# Patient Record
Sex: Female | Born: 1940 | Race: White | Hispanic: No | Smoking: Former smoker
Health system: Southern US, Community
[De-identification: ages and names within clinical notes are randomized; demographics above are authoritative.]

## PROBLEM LIST (undated history)

## (undated) DIAGNOSIS — E119 Type 2 diabetes mellitus without complications: Secondary | ICD-10-CM

## (undated) DIAGNOSIS — C541 Malignant neoplasm of endometrium: Secondary | ICD-10-CM

## (undated) DIAGNOSIS — C3490 Malignant neoplasm of unspecified part of unspecified bronchus or lung: Secondary | ICD-10-CM

## (undated) DIAGNOSIS — C649 Malignant neoplasm of unspecified kidney, except renal pelvis: Secondary | ICD-10-CM

## (undated) DIAGNOSIS — C801 Malignant (primary) neoplasm, unspecified: Secondary | ICD-10-CM

## (undated) HISTORY — PX: APPENDECTOMY: SHX54

## (undated) HISTORY — PX: LUNG CANCER SURGERY: SHX702

## (undated) HISTORY — PX: ABDOMINAL HYSTERECTOMY: SHX81

---

## 2012-01-12 ENCOUNTER — Ambulatory Visit: Payer: Self-pay | Admitting: Family Medicine

## 2015-10-08 ENCOUNTER — Ambulatory Visit
Admission: RE | Admit: 2015-10-08 | Discharge: 2015-10-08 | Disposition: A | Discharge status: Home / Self Care | Payer: Medicare Other | Source: Ambulatory Visit | Attending: Family Medicine | Admitting: Family Medicine

## 2015-10-08 ENCOUNTER — Other Ambulatory Visit: Payer: Self-pay | Admitting: Family Medicine

## 2015-10-08 DIAGNOSIS — Z85118 Personal history of other malignant neoplasm of bronchus and lung: Secondary | ICD-10-CM | POA: Insufficient documentation

## 2015-10-08 DIAGNOSIS — M545 Low back pain: Secondary | ICD-10-CM | POA: Diagnosis present

## 2015-10-08 DIAGNOSIS — M8588 Other specified disorders of bone density and structure, other site: Secondary | ICD-10-CM | POA: Insufficient documentation

## 2016-02-26 ENCOUNTER — Encounter: Payer: Self-pay | Admitting: Emergency Medicine

## 2016-02-26 ENCOUNTER — Ambulatory Visit
Admission: EM | Admit: 2016-02-26 | Discharge: 2016-02-26 | Disposition: A | Discharge status: Home / Self Care | Payer: Medicare Other | Attending: Family Medicine | Admitting: Family Medicine

## 2016-02-26 DIAGNOSIS — M545 Low back pain, unspecified: Secondary | ICD-10-CM

## 2016-02-26 DIAGNOSIS — S39012A Strain of muscle, fascia and tendon of lower back, initial encounter: Secondary | ICD-10-CM | POA: Diagnosis not present

## 2016-02-26 HISTORY — DX: Malignant neoplasm of unspecified kidney, except renal pelvis: C64.9

## 2016-02-26 HISTORY — DX: Malignant (primary) neoplasm, unspecified: C80.1

## 2016-02-26 HISTORY — DX: Malignant neoplasm of unspecified part of unspecified bronchus or lung: C34.90

## 2016-02-26 HISTORY — DX: Type 2 diabetes mellitus without complications: E11.9

## 2016-02-26 HISTORY — DX: Malignant neoplasm of endometrium: C54.1

## 2016-02-26 MED ORDER — OXYCODONE-ACETAMINOPHEN 5-325 MG PO TABS: 1.0000 | ORAL_TABLET | Freq: Three times a day (TID) | ORAL | 0 refills | Status: AC | PRN

## 2016-02-26 NOTE — ED Triage Notes (Signed)
Patient c/o lower back pain off and on for several months.  Patient states that she needs a refill on her Oxycodone.

## 2016-02-26 NOTE — ED Provider Notes (Signed)
CSN: [id number]     Arrival date & time 02/26/16  1337 History   None    Chief Complaint  Patient presents with  . Back Pain   (Consider location/radiation/quality/duration/timing/severity/associated sxs/prior Treatment) HPI  This a 74 year old female who presents today complaining of the sudden onset of nonradiating low back pain. She indicates the midline as the area of maximal tenderness. She has had recurrent back pain for approximately 6 months I would wax and wane. She states this occurred when she had a new mattress that she was making her bed and had lift up the and straining her low back. Pain is nonradiating she has no incontinence. She's not able to utilize ibuprofen because of previous GI bleed. She has been receiving oxycodone and through Dr. Clemmie Krill and unfortunately Dr. Clemmie Krill is out of town on a vacation. He does have Flexeril at home but states that it doesn't help very much just makes her sleepy.     Past Medical History:  Diagnosis Date  . 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 Cancer ([place])   . Diabetes mellitus without complication ([place])   . Endometrial cancer ([place])   . Lung cancer [facility])    Past Surgical History:  Procedure Laterality Date  . ABDOMINAL HYSTERECTOMY    . APPENDECTOMY    . LUNG CANCER SURGERY Right    History reviewed. No pertinent family history. Social History  Substance Use Topics  . Smoking status: Former Research scientist (life sciences)  . Smokeless tobacco: Never Used  . Alcohol use No   OB History    No data available     Review of Systems  Constitutional: Positive for activity change. Negative for chills, fatigue and fever.  Musculoskeletal: Positive for back pain, gait problem and myalgias.  All other systems reviewed and are negative.   Allergies  Clindamycin/lincomycin  Home Medications   Prior to Admission medications   Medication Sig Start Date End Date Taking? Authorizing Provider  atorvastatin (LIPITOR) 20 MG tablet Take 20 mg by mouth daily.   Yes Historical  Provider, MD  Fe Fum-FePoly-Vit C-Vit B3 (INTEGRA PO) Take 1 tablet by mouth daily.   Yes Historical Provider, MD  lisinopril (PRINIVIL,ZESTRIL) 2.5 MG tablet Take 2.5 mg by mouth daily.   Yes Historical Provider, MD  metFORMIN (GLUCOPHAGE) 500 MG tablet Take 1,000 mg by mouth 2 (two) times daily with a meal.   Yes Historical Provider, MD  oxyCODONE-acetaminophen (PERCOCET/ROXICET) 5-325 MG tablet Take 1 tablet by mouth every 8 (eight) hours as needed for moderate pain or severe pain. 02/26/16   Lorin Picket, PA-C   Meds Ordered and Administered this Visit  Medications - No data to display  BP 134/71 (BP Location: Right Arm)   Pulse 98   Temp 97.7 F (36.5 C) (Tympanic)   Resp 17   SpO2 95%  No data found.   Physical Exam  Constitutional: She appears well-developed and well-nourished. No distress.  HENT:  Head: Normocephalic and atraumatic.  Eyes: EOM are normal. Pupils are equal, round, and reactive to light. Right eye exhibits no discharge. Left eye exhibits no discharge.  Neck: Normal range of motion. Neck supple.  Musculoskeletal:  Examination of lumbar spine shows a flattening of the lordotic curve. Her pelvis is level in stance. Next the tenderness is in the paraspinous muscles at L4-5 level. Reproduces her symptoms. She has marked decreased range of motion of the lumbar spine.  Skin: She is not diaphoretic.  Nursing note and vitals reviewed.   Urgent Care  Course   Clinical Course    Procedures (including critical care time)  Labs Review Labs Reviewed - No data to display  Imaging Review No results found.   Visual Acuity Review  Right Eye Distance:   Left Eye Distance:   Bilateral Distance:    Right Eye Near:   Left Eye Near:    Bilateral Near:         MDM   1. Midline low back pain without sciatica   2. Lumbar strain, initial encounter    Discharge Medication List as of 02/26/2016  2:26 PM    START taking these medications   Details   oxyCODONE-acetaminophen (PERCOCET/ROXICET) 5-325 MG tablet Take 1 tablet by mouth every 8 (eight) hours as needed for moderate pain or severe pain., Starting Thu 02/26/2016, Print      Plan: 1. Test/x-ray results and diagnosis reviewed with patient 2. rx as per orders; risks, benefits, potential side effects reviewed with patient 3. Recommend supportive treatment with Rest and avoidance of symptoms. She should take Flexeril at home. Since Dr. Clemmie Krill is out of town at the present time I will prescribe the patient only 6 oxycodone to use judiciously until Dr. Clemmie Krill returns and can follow-up with Dr. Clemmie Krill. I told her if she has worsening of her symptoms she should be seen in the emergency department. 4. F/u prn if symptoms worsen or don't improve     Lorin Picket, PA-C 02/26/16 1435

## 2016-10-07 ENCOUNTER — Ambulatory Visit: Payer: Medicare Other

## 2016-10-07 ENCOUNTER — Ambulatory Visit
Admission: EM | Admit: 2016-10-07 | Discharge: 2016-10-07 | Disposition: A | Discharge status: Home / Self Care | Payer: Medicare Other | Attending: Family Medicine | Admitting: Family Medicine

## 2016-10-07 ENCOUNTER — Ambulatory Visit (INDEPENDENT_AMBULATORY_CARE_PROVIDER_SITE_OTHER): Payer: Medicare Other

## 2016-10-07 DIAGNOSIS — S8002XA Contusion of left knee, initial encounter: Secondary | ICD-10-CM

## 2016-10-07 DIAGNOSIS — S0010XA Contusion of unspecified eyelid and periocular area, initial encounter: Secondary | ICD-10-CM | POA: Diagnosis not present

## 2016-10-07 DIAGNOSIS — S8001XA Contusion of right knee, initial encounter: Secondary | ICD-10-CM

## 2016-10-07 DIAGNOSIS — W19XXXA Unspecified fall, initial encounter: Secondary | ICD-10-CM

## 2016-10-07 MED ORDER — MUPIROCIN 2 % EX OINT: TOPICAL_OINTMENT | CUTANEOUS | 0 refills | Status: AC

## 2016-10-07 MED ORDER — MUPIROCIN 2 % EX OINT: TOPICAL_OINTMENT | CUTANEOUS | 0 refills | Status: DC

## 2016-10-07 NOTE — ED Notes (Signed)
ED Alveta Heimlich notified of pt regarding concerns voiced to this RN by pt neighbor who is accompanying pt. Neighbot out of pt room to nursing station voices concerns regarding pt having intermittent confusion this week ongoing since Sunday. Per neighbor pt refuses to go to hospital and agreed to see pcp yesterday where labs were done and MD wanted a head CT but after pt got home notified MD that she wasn't going and told them "she had a flat tire" Neighbor reports pt has a key to her house and let her self in this morning without her being home which is out of the ordinary for pt. Pt A&OX4 at this time. When this RN mentioned possibility of needing further care in the [facility] emergency department. Pt yelled stating "I am not going to that damn place, last time I was there I waited 18 hours in that waiting room" Pt redirected by RN and MD aware of pt refusal for ED care.

## 2016-10-07 NOTE — ED Provider Notes (Signed)
[facility]    CSN: [id number] Arrival date & time: 10/07/16  1712     History   Chief Complaint No chief complaint on file.   HPI Hannah Fletcher is a 76 y.o. female.   She is a 76 year old white female who fell today. Palate she saw her PCP yesterday and was having signs of altered mental status PCP was concerned and ordered an outpatient CT scan. She's fallen she has multiple contusions on her face and around her eyes and scalp. She denies any loss of consciousness when she fell. However she lives by self and knows it witnessed fall. She states fall occurred about 3:30. She states that she landed on both knees, both knees are hurting her. She also contused and bruised her elbows as well. His little bit unusual to match the patient having a 5 point stance which she fell injuring both knees both elbows and her face and head as well with one fall. Question whether her tetanus up-to-date to me that she does not know the test Dr. Clemmie Krill at the Dr. Kristen Cardinal is up-to-date.  She has a past history and no sarcoma diabetes endometrial cancer and lung cancer as well. She's had lung cancer surgery abdominal hysterectomy appendectomy and she uses O2 at night to help her sleep she does have history COPD as well. She is on Plavix and aspirin blood thinners as well. Had a history of coronary artery disease also. She is a former smoker and she is allergic to clindamycin lincomycin as well.  Her son is in the room at this time. Apparently is no status change last year she wanted being admitted which went the ER but she refused to go to the ER this time. Explained to her that being acute blood thinners and with the multiple contusions that she had a bilirubin for more comfortable having CT done in the ER with some possible see if MRI and C2 contrast be done if needed. She refuses. She did request pain medication. The form of Percocet.   The history is provided by the patient and a relative.  No language interpreter was used.  Fall  This is a new problem. The current episode started 12 to 24 hours ago. The problem has not changed since onset.Pertinent negatives include no chest pain, no abdominal pain, no headaches and no shortness of breath. Nothing aggravates the symptoms. Nothing relieves the symptoms. She has tried nothing for the symptoms. The treatment provided no relief.    Past Medical History:  Diagnosis Date  . 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 Cancer ([place])   . Diabetes mellitus without complication ([place])   . Endometrial cancer ([place])   . Lung cancer ([place])     There are no active problems to display for this patient.   Past Surgical History:  Procedure Laterality Date  . ABDOMINAL HYSTERECTOMY    . APPENDECTOMY    . LUNG CANCER SURGERY Right     OB History    No data available       Home Medications    Prior to Admission medications   Medication Sig Start Date End Date Taking? Authorizing Provider  clopidogrel (PLAVIX) 75 MG tablet Take 75 mg by mouth daily.   Yes Historical Provider, MD  atorvastatin (LIPITOR) 20 MG tablet Take 20 mg by mouth daily.    Historical Provider, MD  Fe Fum-FePoly-Vit C-Vit B3 (INTEGRA PO) Take 1 tablet by mouth daily.    Historical Provider, MD  lisinopril (  PRINIVIL,ZESTRIL) 2.5 MG tablet Take 2.5 mg by mouth daily.    Historical Provider, MD  metFORMIN (GLUCOPHAGE) 500 MG tablet Take 1,000 mg by mouth 2 (two) times daily with a meal.    Historical Provider, MD  mupirocin ointment (BACTROBAN) 2 % Apply over multiple abrasions 2-3 times a day 10/07/16   Frederich Cha, MD  oxyCODONE-acetaminophen (PERCOCET/ROXICET) 5-325 MG tablet Take 1 tablet by mouth every 8 (eight) hours as needed for moderate pain or severe pain. 02/26/16   Lorin Picket, PA-C    Family History No family history on file.  Social History Social History  Substance Use Topics  . Smoking status: Former Research scientist (life sciences)  . Smokeless tobacco: Never Used  . Alcohol use No      Allergies   Clindamycin/lincomycin   Review of Systems Review of Systems  HENT: Positive for facial swelling.   Respiratory: Negative for shortness of breath.   Cardiovascular: Negative for chest pain.  Gastrointestinal: Negative for abdominal pain.  Musculoskeletal: Positive for joint swelling and myalgias.  Skin: Positive for wound.  Neurological: Negative for headaches.  All other systems reviewed and are negative.    Physical Exam Triage Vital Signs ED Triage Vitals  Enc Vitals Group     BP 10/07/16 1723 (!) 95/47     Pulse Rate 10/07/16 1723 87     Resp 10/07/16 1723 16     Temp 10/07/16 1723 97.6 F (36.4 C)     Temp Source 10/07/16 1723 Oral     SpO2 10/07/16 1723 98 %     Weight 10/07/16 1728 125 lb (56.7 kg)     Height --      Head Circumference --      Peak Flow --      Pain Score 10/07/16 1729 10     Pain Loc --      Pain Edu? --      Excl. in [place]? --    No data found.   Updated Vital Signs BP (!) 95/47 (BP Location: Right Arm)   Pulse 87   Temp 97.6 F (36.4 C) (Oral)   Resp 16   Wt 125 lb (56.7 kg)   SpO2 98%   Visual Acuity Right Eye Distance:   Left Eye Distance:   Bilateral Distance:    Right Eye Near:   Left Eye Near:    Bilateral Near:     Physical Exam  Constitutional: She appears well-developed and well-nourished.  HENT:  Head: Normocephalic and atraumatic.    Right Ear: Hearing, tympanic membrane, external ear and ear canal normal.  Left Ear: Hearing, tympanic membrane, external ear and ear canal normal.  Nose: Right sinus exhibits no maxillary sinus tenderness and no frontal sinus tenderness. Left sinus exhibits no maxillary sinus tenderness and no frontal sinus tenderness.  Mouth/Throat: Uvula is midline and oropharynx is clear and moist.  Eyes: Pupils are equal, round, and reactive to light.  Neck: Normal range of motion. Neck supple.  Cardiovascular: Normal rate and regular rhythm.   Pulmonary/Chest: Effort normal  and breath sounds normal.  Neurological: She is alert. No cranial nerve deficit.  Skin: Skin is warm. Bruising noted.     H multiple bruising on both arms elbows she has multiple abrasions on arms elbows and on the knees swelling in both knees as well. She has bruising on the face for head and around both eyes. Neck supple  Psychiatric: She has a normal mood and affect.  Vitals reviewed.  UC Treatments / Results  Labs (all labs ordered are listed, but only abnormal results are displayed) Labs Reviewed - No data to display  EKG  EKG Interpretation None       Radiology Dg Knee Complete 4 Views Left  Result Date: 10/07/2016 CLINICAL DATA:  Fall bilateral knee pain- EXAM: LEFT KNEE - COMPLETE 4+ VIEW COMPARISON:  None. FINDINGS: No fracture or dislocation. No definite effusion. Mild diffuse osteopenia. Patchy arterial calcifications. Soft tissues otherwise unremarkable. IMPRESSION: Negative for fracture or dislocation. Arterial calcifications. Electronically Signed   By: Lucrezia Europe M.D.   On: 10/07/2016 19:12   Dg Knee Complete 4 Views Right  Result Date: 10/07/2016 CLINICAL DATA:  Fall bilateral knee pain- EXAM: RIGHT KNEE - COMPLETE 4+ VIEW COMPARISON:  None. FINDINGS: No fracture or dislocation. Moderate effusion in the suprapatellar bursa. Mild diffuse osteopenia. Narrowing of the articular cartilage in the lateral compartment with marginal spur formation from the lateral femoral condyle and lateral tibial plateau, and some subchondral sclerosis. Small spurs from the medial tibial plateau and patellar articular surface. Patchy arterial calcifications. Regional soft tissues otherwise unremarkable. IMPRESSION: 1. Negative for fracture or dislocation. 2. Tricompartmental degenerative change most marked laterally. 3. Moderate knee effusion. 4. Arterial calcifications. Electronically Signed   By: Lucrezia Europe M.D.   On: 10/07/2016 19:11    Procedures Procedures (including critical care  time)  Medications Ordered in UC Medications - No data to display   Initial Impression / Assessment and Plan / UC Course  I have reviewed the triage vital signs and the nursing notes.  Pertinent labs & imaging results that were available during my care of the patient were reviewed by me and considered in my medical decision making (see chart for details).   explained to patient and her son opposition nothing she should go to the ED for the facial trauma by being on 2 blood thinners history of some altered mental status and also with a history that was not consistent with just one fall but appears be multiple falls. She is adamant that she refuses to go and is reassure her that she is only seen on away when she walks to the ED which we can't. She will sign AMA form state that she refuses to have CT scan in the ED and refused to go to the ED and we will x-ray both knees unfortunate skin tags on her elbows both forearms and both knees are not going to be amiable to glue since skin flaps were basically abraded off. Will give a prescription for Bactrim ointment to use twice a day to prevent infection.    Final Clinical Impressions(s) / UC Diagnoses   Final diagnoses:  Contusion of left knee, initial encounter  Contusion of right knee, initial encounter  Contusion of periocular region, unspecified laterality, initial encounter    New Prescriptions Current Discharge Medication List    START taking these medications   Details  mupirocin ointment (BACTROBAN) 2 % Apply over multiple abrasions 2-3 times a day Qty: 22 g, Refills: 0       Should be noted the patient did experience confusion while having x-rays of the knees extended the knees were negative for any fractures just f arthritis and effusion.son is also concerned about the head injury but does not want to go his mother's advice may form was was given asthma signed by    Frederich Cha, MD 10/07/16 [phone]

## 2016-10-19 ENCOUNTER — Telehealth: Payer: Self-pay | Admitting: Family Medicine

## 2016-10-19 ENCOUNTER — Ambulatory Visit (INDEPENDENT_AMBULATORY_CARE_PROVIDER_SITE_OTHER): Payer: Medicare Other

## 2016-10-19 ENCOUNTER — Ambulatory Visit
Admission: EM | Admit: 2016-10-19 | Discharge: 2016-10-19 | Disposition: A | Discharge status: Home / Self Care | Payer: Medicare Other | Attending: Family Medicine | Admitting: Family Medicine

## 2016-10-19 DIAGNOSIS — S50812A Abrasion of left forearm, initial encounter: Secondary | ICD-10-CM | POA: Diagnosis not present

## 2016-10-19 DIAGNOSIS — T148XXA Other injury of unspecified body region, initial encounter: Secondary | ICD-10-CM | POA: Diagnosis not present

## 2016-10-19 DIAGNOSIS — S50312A Abrasion of left elbow, initial encounter: Secondary | ICD-10-CM

## 2016-10-19 DIAGNOSIS — W19XXXA Unspecified fall, initial encounter: Secondary | ICD-10-CM | POA: Diagnosis not present

## 2016-10-19 MED ORDER — AMOXICILLIN 875 MG PO TABS: 875.0000 mg | ORAL_TABLET | Freq: Two times a day (BID) | ORAL | 0 refills | Status: AC

## 2016-10-19 MED ORDER — TRAMADOL HCL 50 MG PO TABS: 50.0000 mg | ORAL_TABLET | Freq: Four times a day (QID) | ORAL | 0 refills | Status: AC | PRN

## 2016-10-19 NOTE — Telephone Encounter (Signed)
rx for amoxicillin sent

## 2016-10-19 NOTE — ED Provider Notes (Signed)
[facility]    CSN: [id number] Arrival date & time: 10/19/16  1344     History   Chief Complaint Chief Complaint  Patient presents with  . Fall    HPI Hannah Fletcher is a 76 y.o. female.   76 yo female presents with a c/o left forearm and elbow injuries after falling at home early this morning. Patient denies hitting her head, loss of consciousness, fainting, numbness/tingling, vision changes, vomiting. Patient has a history of falls in the past. Takes Plavix and aspirin.    The history is provided by the patient.    Past Medical History:  Diagnosis Date  . 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 Cancer ([place])   . Diabetes mellitus without complication ([place])   . Endometrial cancer ([place])   . Lung cancer ([place])     There are no active problems to display for this patient.   Past Surgical History:  Procedure Laterality Date  . ABDOMINAL HYSTERECTOMY    . APPENDECTOMY    . LUNG CANCER SURGERY Right     OB History    No data available       Home Medications    Prior to Admission medications   Medication Sig Start Date End Date Taking? Authorizing Provider  atorvastatin (LIPITOR) 20 MG tablet Take 20 mg by mouth daily.   Yes Historical Provider, MD  clopidogrel (PLAVIX) 75 MG tablet Take 75 mg by mouth daily.   Yes Historical Provider, MD  Fe Fum-FePoly-Vit C-Vit B3 (INTEGRA PO) Take 1 tablet by mouth daily.   Yes Historical Provider, MD  lisinopril (PRINIVIL,ZESTRIL) 2.5 MG tablet Take 2.5 mg by mouth daily.   Yes Historical Provider, MD  metFORMIN (GLUCOPHAGE) 500 MG tablet Take 1,000 mg by mouth 2 (two) times daily with a meal.   Yes Historical Provider, MD  mupirocin ointment (BACTROBAN) 2 % Apply over multiple abrasions 2-3 times a day 10/07/16  Yes Frederich Cha, MD  oxyCODONE-acetaminophen (PERCOCET/ROXICET) 5-325 MG tablet Take 1 tablet by mouth every 8 (eight) hours as needed for moderate pain or severe pain. 02/26/16   Lorin Picket, PA-C  traMADol (ULTRAM)  50 MG tablet Take 1 tablet (50 mg total) by mouth every 6 (six) hours as needed. 10/19/16   Norval Gable, MD    Family History History reviewed. No pertinent family history.  Social History Social History  Substance Use Topics  . Smoking status: Former Research scientist (life sciences)  . Smokeless tobacco: Never Used  . Alcohol use No     Allergies   Clindamycin/lincomycin   Review of Systems Review of Systems   Physical Exam Triage Vital Signs ED Triage Vitals  Enc Vitals Group     BP 10/19/16 1408 (!) 101/58     Pulse Rate 10/19/16 1408 83     Resp 10/19/16 1408 15     Temp 10/19/16 1408 97.5 F (36.4 C)     Temp Source 10/19/16 1408 Oral     SpO2 10/19/16 1408 94 %     Weight --      Height --      Head Circumference --      Peak Flow --      Pain Score 10/19/16 1355 5     Pain Loc --      Pain Edu? --      Excl. in [place]? --    No data found.   Updated Vital Signs BP (!) 101/58 (BP Location: Right Arm)   Pulse 83  Temp 97.5 F (36.4 C) (Oral)   Resp 15   SpO2 94%   Visual Acuity Right Eye Distance:   Left Eye Distance:   Bilateral Distance:    Right Eye Near:   Left Eye Near:    Bilateral Near:     Physical Exam  Constitutional: She is oriented to person, place, and time. She appears well-developed and well-nourished. No distress.  HENT:  Head: Normocephalic.  Right Ear: Tympanic membrane, external ear and ear canal normal.  Left Ear: Tympanic membrane, external ear and ear canal normal.  Nose: Nose normal.  Mouth/Throat: Uvula is midline, oropharynx is clear and moist and mucous membranes are normal.  Eyes: Conjunctivae and EOM are normal. Pupils are equal, round, and reactive to light. Right eye exhibits no discharge. Left eye exhibits no discharge. No scleral icterus.  Neck: Normal range of motion. Neck supple. No JVD present. No tracheal deviation present. No thyromegaly present.  Cardiovascular: Normal rate, regular rhythm, normal heart sounds and intact distal  pulses.   No murmur heard. Pulmonary/Chest: Effort normal and breath sounds normal. No stridor. No respiratory distress. She has no wheezes. She has no rales. She exhibits no tenderness.  Abdominal: Soft. Bowel sounds are normal. She exhibits no distension. There is no tenderness. There is no guarding.  Musculoskeletal: She exhibits no edema or tenderness.  Lymphadenopathy:    She has no cervical adenopathy.  Neurological: She is alert and oriented to person, place, and time. She has normal reflexes. No cranial nerve deficit or sensory deficit.  Skin: Skin is warm and dry. She is not diaphoretic.  Large skin abrasions and tears to the left forearm and elbow area; actively bleeding  Psychiatric: She has a normal mood and affect. Her behavior is normal. Judgment and thought content normal.  Nursing note and vitals reviewed.    UC Treatments / Results  Labs (all labs ordered are listed, but only abnormal results are displayed) Labs Reviewed - No data to display  EKG  EKG Interpretation None       Radiology Dg Elbow Complete Left  Result Date: 10/19/2016 CLINICAL DATA:  Multiple trauma secondary to a fall this morning. EXAM: LEFT ELBOW - COMPLETE 3+ VIEW COMPARISON:  None. FINDINGS: There is no evidence of fracture, dislocation, or joint effusion. There is no evidence of arthropathy or other focal bone abnormality. Soft tissues are unremarkable. IMPRESSION: Negative. Electronically Signed   By: Lorriane Shire M.D.   On: 10/19/2016 15:13   Dg Forearm Left  Result Date: 10/19/2016 CLINICAL DATA:  Multiple trauma secondary to a fall this morning. EXAM: LEFT FOREARM - 2 VIEW COMPARISON:  None. FINDINGS: There is no evidence of fracture or other focal bone lesions. Soft tissues are unremarkable. IMPRESSION: Negative. Electronically Signed   By: Lorriane Shire M.D.   On: 10/19/2016 15:14    Procedures Procedures (including critical care time)  Medications Ordered in UC Medications - No  data to display   Initial Impression / Assessment and Plan / UC Course  I have reviewed the triage vital signs and the nursing notes.  Pertinent labs & imaging results that were available during my care of the patient were reviewed by me and considered in my medical decision making (see chart for details).       Final Clinical Impressions(s) / UC Diagnoses   Final diagnoses:  Fall, initial encounter  Abrasion    New Prescriptions Discharge Medication List as of 10/19/2016  3:49 PM    START  taking these medications   Details  traMADol (ULTRAM) 50 MG tablet Take 1 tablet (50 mg total) by mouth every 6 (six) hours as needed., Starting Tue 10/19/2016, Normal       1. x-ray results and diagnosis reviewed with patient and daughter 2. rx as per orders above; reviewed possible side effects, interactions, risks and benefits; rx for amoxicillin  3. Recommend supportive treatment with wound care 4. Follow-up with PCP in 2-3 days or sooner prn   Norval Gable, MD 10/19/16 1945

## 2016-10-19 NOTE — Discharge Instructions (Signed)
Follow up with Primary Care physician within 1 week

## 2016-10-19 NOTE — ED Triage Notes (Addendum)
Pt fell this am and has multiple injuries, to her right arm, face, legs. Her right leg is visible swollen also.

## 2016-10-20 ENCOUNTER — Ambulatory Visit
Admission: RE | Admit: 2016-10-20 | Discharge: 2016-10-20 | Disposition: A | Discharge status: Home / Self Care | Payer: Medicare Other | Source: Ambulatory Visit | Attending: Family Medicine | Admitting: Family Medicine

## 2016-10-20 ENCOUNTER — Other Ambulatory Visit: Payer: Self-pay | Admitting: Family Medicine

## 2016-10-20 DIAGNOSIS — X58XXXA Exposure to other specified factors, initial encounter: Secondary | ICD-10-CM | POA: Insufficient documentation

## 2016-10-20 DIAGNOSIS — S9031XA Contusion of right foot, initial encounter: Secondary | ICD-10-CM

## 2016-10-20 DIAGNOSIS — S9032XA Contusion of left foot, initial encounter: Secondary | ICD-10-CM

## 2017-07-19 DEATH — deceased

## 2018-10-06 IMAGING — CR DG FOREARM 2V*L*
2 series · 2 of 2 positions shown · non-contrast
Comparison: None.

CLINICAL DATA: Multiple trauma secondary to a fall this morning.

EXAM:
LEFT FOREARM - 2 VIEW

[forearm ap]
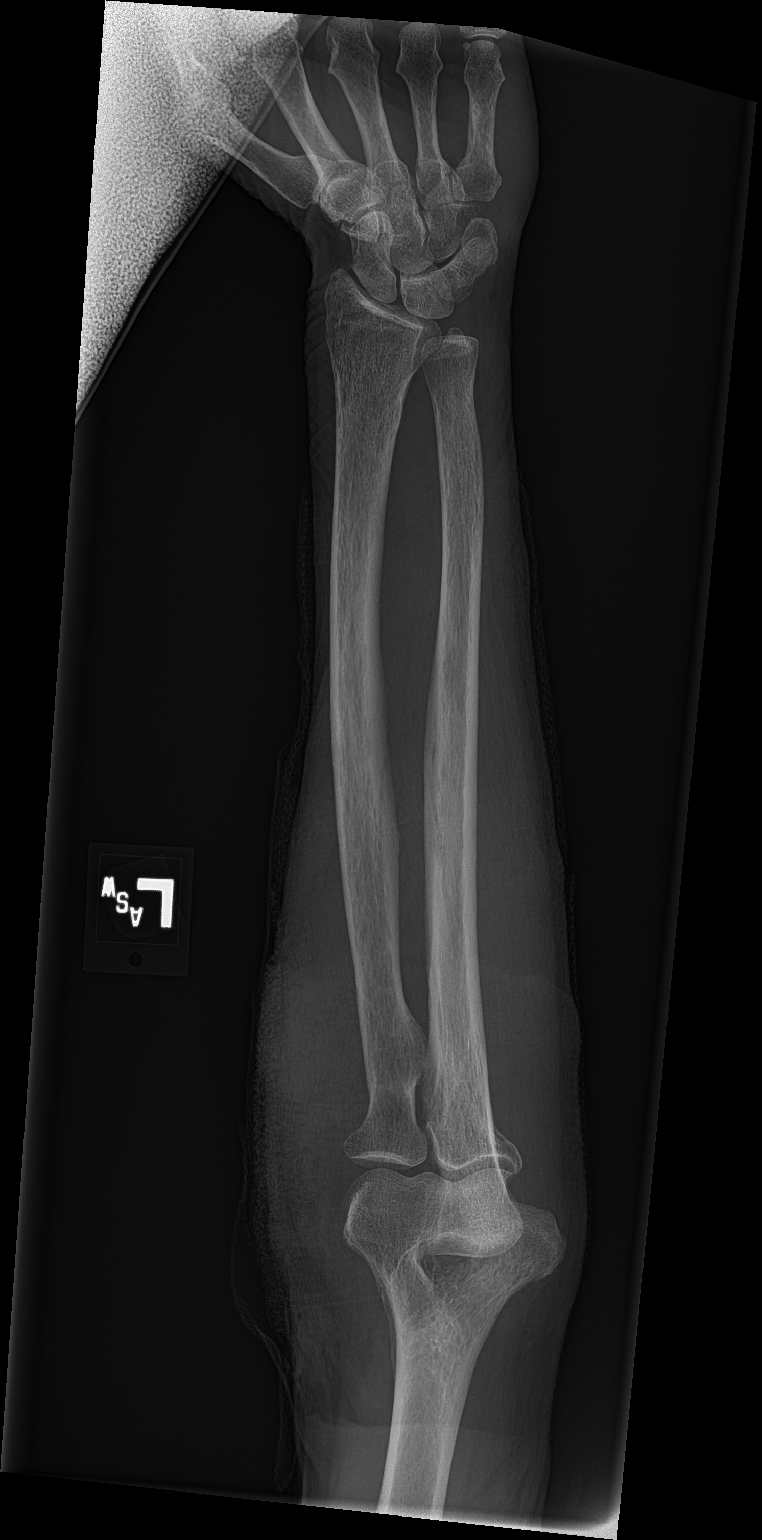

[forearm lat]
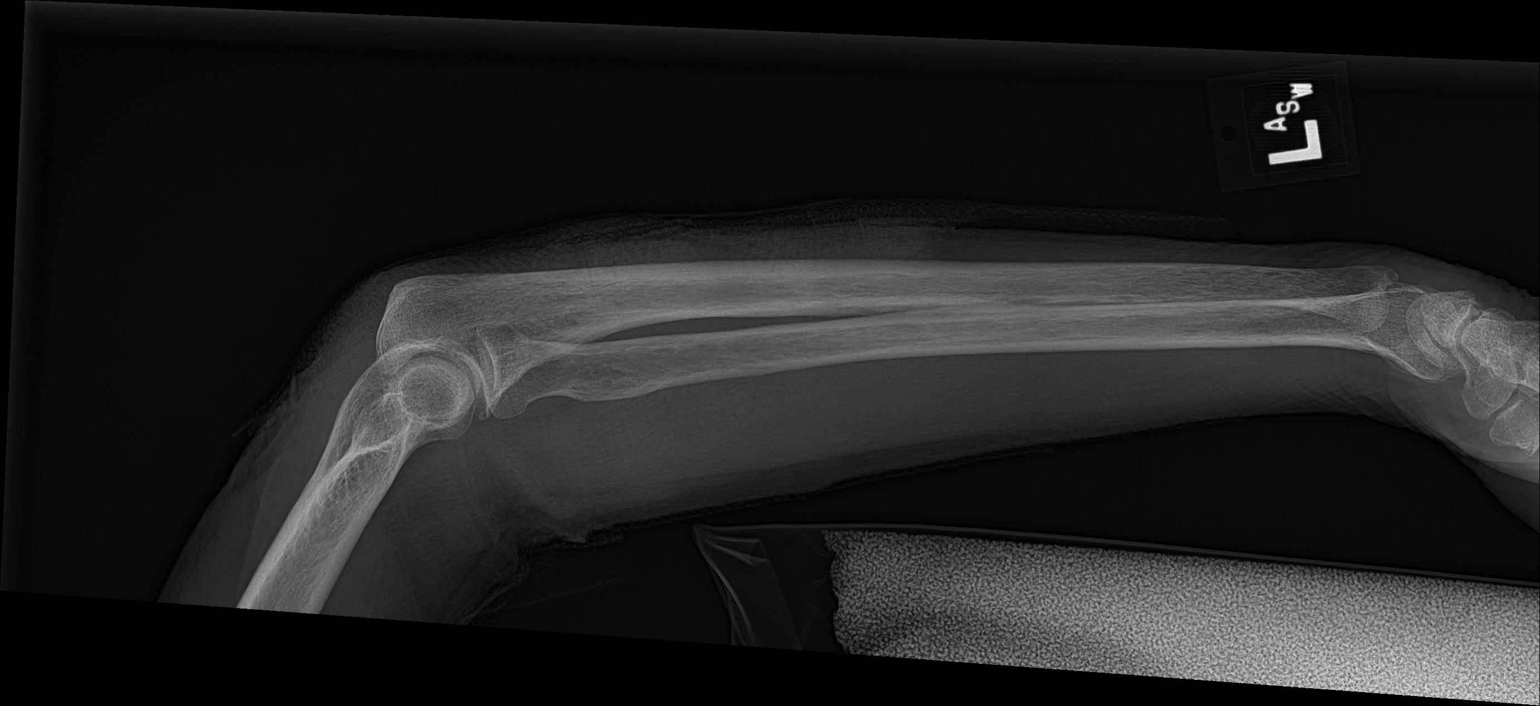

[2 of 2 positions shown; findings below may reference images not displayed]

FINDINGS: There is no evidence of fracture or other focal bone lesions. Soft
tissues are unremarkable.
IMPRESSION: Negative.
